# Patient Record
Sex: Male | Born: 1970 | Race: White | Hispanic: No | Marital: Single | Smoking: Current every day smoker
Health system: Southern US, Community
[De-identification: ages and names within clinical notes are randomized; demographics above are authoritative.]

## PROBLEM LIST (undated history)

## (undated) DIAGNOSIS — L309 Dermatitis, unspecified: Secondary | ICD-10-CM

## (undated) HISTORY — PX: SHOULDER ARTHROSCOPY WITH ROTATOR CUFF REPAIR: SHX5685

## (undated) HISTORY — PX: OTHER SURGICAL HISTORY: SHX169

## (undated) HISTORY — DX: Dermatitis, unspecified: L30.9

---

## 2004-07-18 ENCOUNTER — Encounter: Admission: RE | Admit: 2004-07-18 | Discharge: 2004-07-18 | Payer: Self-pay | Admitting: Internal Medicine

## 2005-08-18 IMAGING — US US SCROTUM
1 series · 14 of 25 positions shown · non-contrast
Comparison: None.

CLINICAL DATA: Right scrotal pain and swelling over the past few weeks.  
 SCROTAL ULTRASOUND:

[Series 1: unknown · 0.09mm/px · 14 of 40 slices shown]
[im 1/40]
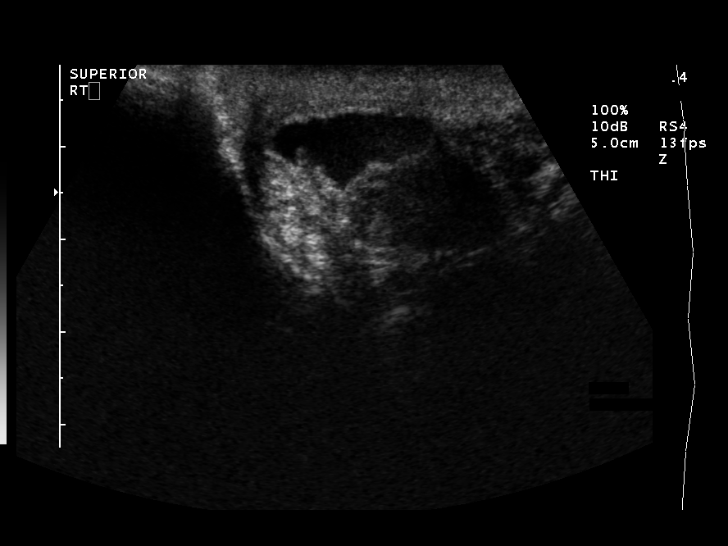
[im 4/40]
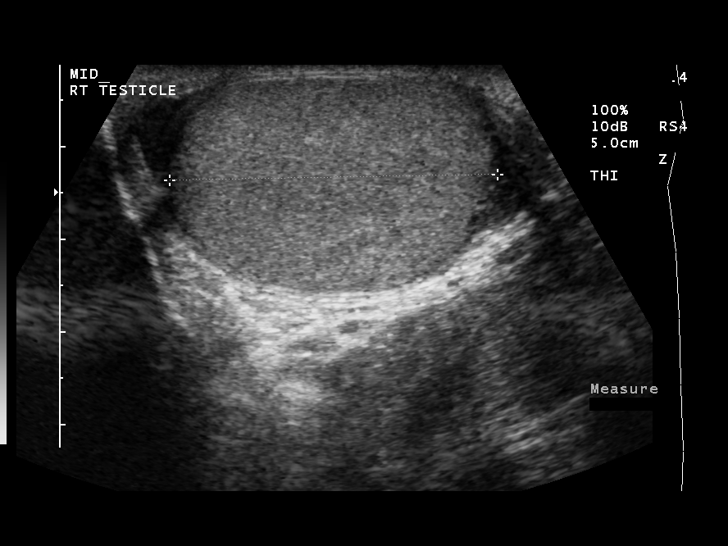
[im 7/40]
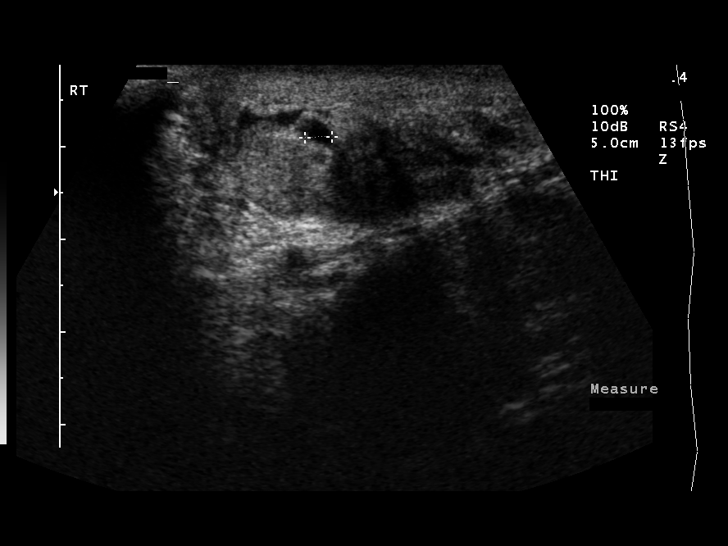
[im 10/40]
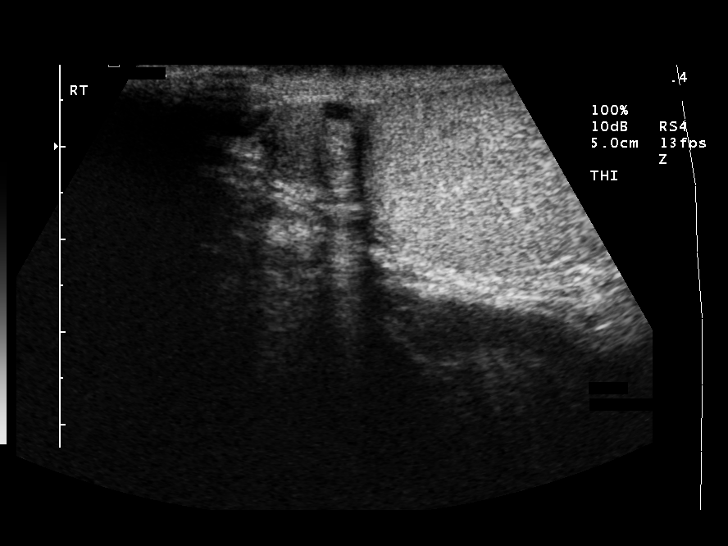
[im 14/40]
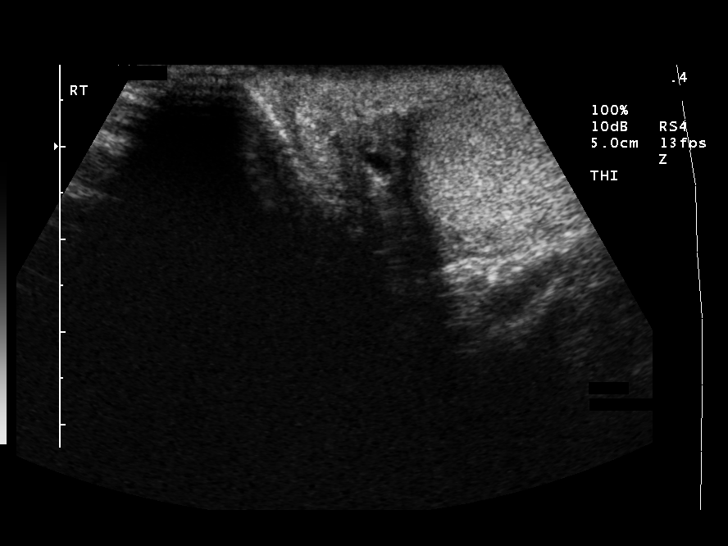
[im 15/40]
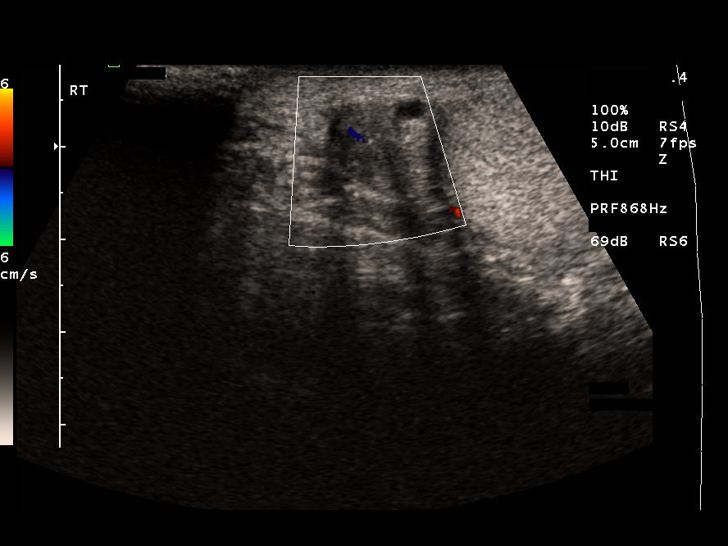
[im 18/40]
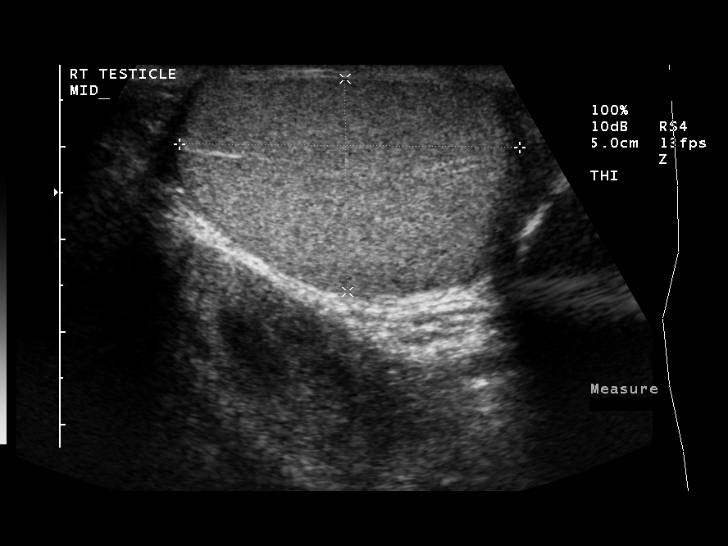
[im 22/40]
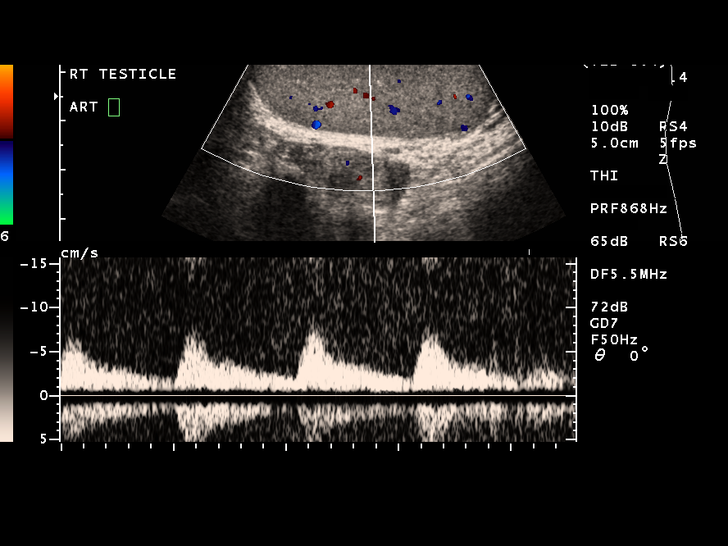
[im 25/40]
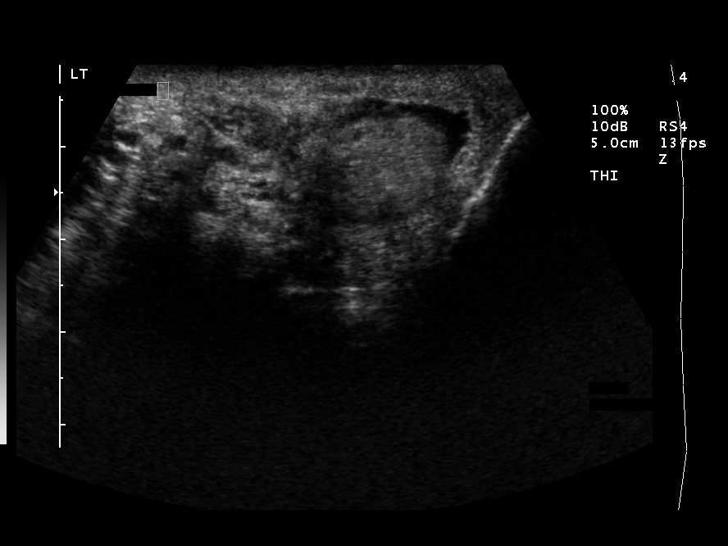
[im 27/40]
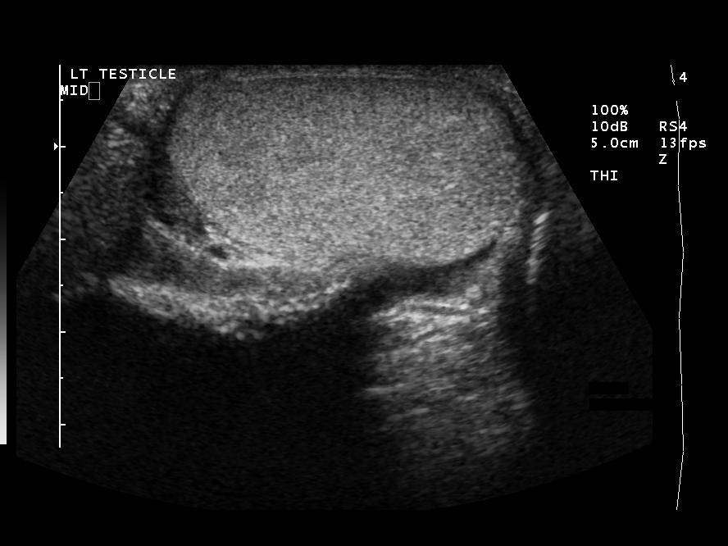
[im 30/40]
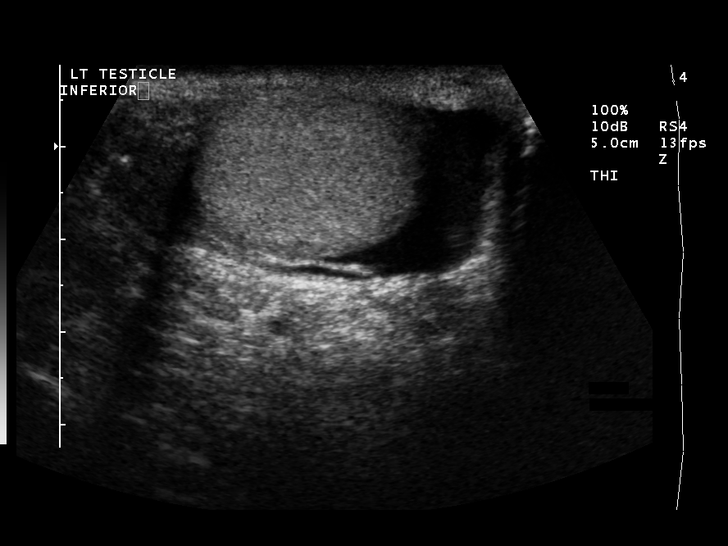
[im 33/40]
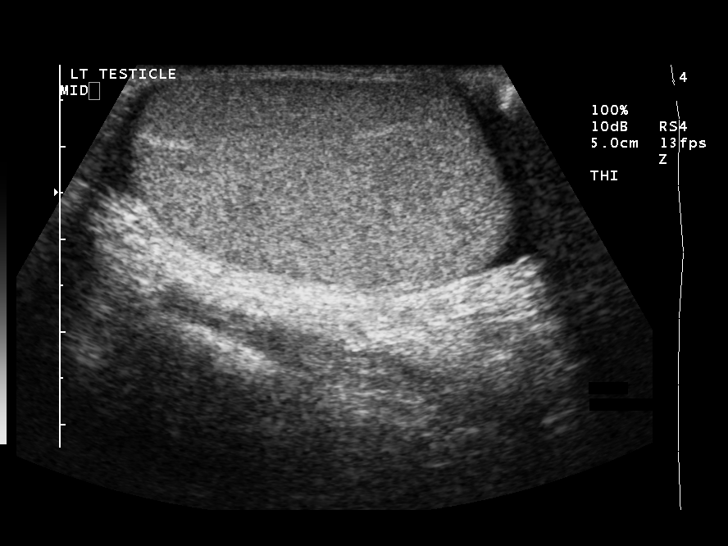
[im 36/40]
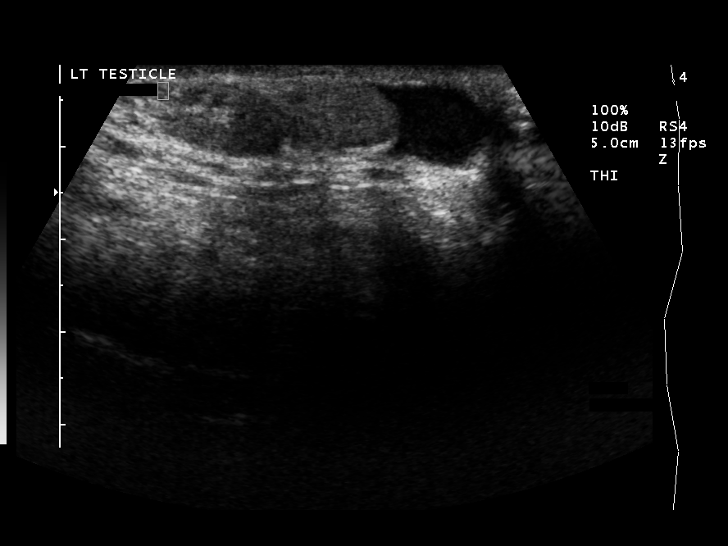
[im 40/40]
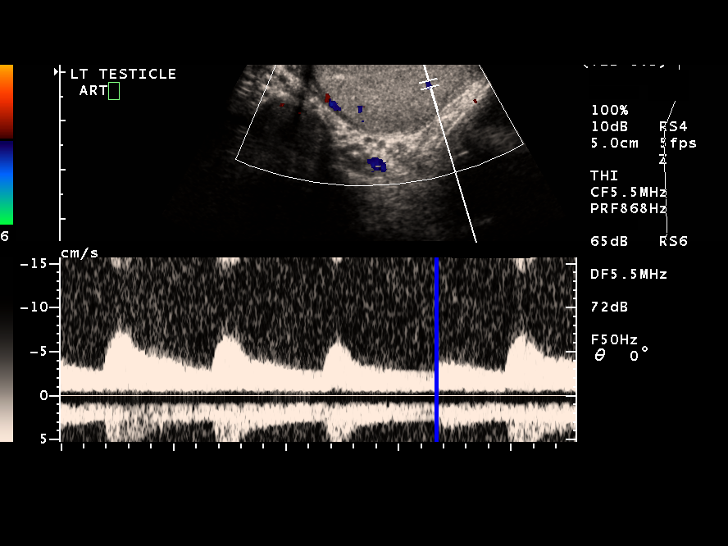

[14 of 25 positions shown; findings below may reference images not displayed]

FINDINGS: Right Testicle:  Normal, 3.6 x 2.3 x 3.5 cm.  Normal arterial signal on Doppler evaluation.
 Left Testicle:  Normal, 3.9 x 2.2 x 3.7 cm.  Normal arterial signal on Doppler evaluation.  
 Right Epididymis:  Two small (approximately 3 mm) epididymal cysts.  No evidence of epididymitis.  
 Left Epididymis:  Normal.
 Other:  Small, insignificant bilateral hydroceles.  No evidence of varicocele.  
 IMPRESSION
 1.  No significant abnormalities.
 2.  Normal testes.  
 3.  Two small (3 mm) right epididymal cysts.  
 4.  Insignificant small bilateral hydroceles.

## 2009-09-21 ENCOUNTER — Ambulatory Visit: Payer: Self-pay | Admitting: Internal Medicine

## 2009-09-21 DIAGNOSIS — Z9189 Other specified personal risk factors, not elsewhere classified: Secondary | ICD-10-CM | POA: Insufficient documentation

## 2009-09-21 DIAGNOSIS — S96819A Strain of other specified muscles and tendons at ankle and foot level, unspecified foot, initial encounter: Secondary | ICD-10-CM

## 2009-09-21 DIAGNOSIS — F17200 Nicotine dependence, unspecified, uncomplicated: Secondary | ICD-10-CM

## 2009-09-21 DIAGNOSIS — S93499A Sprain of other ligament of unspecified ankle, initial encounter: Secondary | ICD-10-CM

## 2009-09-21 DIAGNOSIS — J018 Other acute sinusitis: Secondary | ICD-10-CM | POA: Insufficient documentation

## 2020-06-29 ENCOUNTER — Encounter: Payer: Self-pay | Admitting: Gastroenterology

## 2020-08-11 ENCOUNTER — Other Ambulatory Visit: Payer: Self-pay

## 2020-08-11 ENCOUNTER — Ambulatory Visit (AMBULATORY_SURGERY_CENTER): Payer: Self-pay

## 2020-08-11 ENCOUNTER — Encounter: Payer: Self-pay | Admitting: Gastroenterology

## 2020-08-11 VITALS — Ht 69.0 in | Wt 170.0 lb

## 2020-08-11 DIAGNOSIS — Z1211 Encounter for screening for malignant neoplasm of colon: Secondary | ICD-10-CM

## 2020-08-11 NOTE — Progress Notes (Signed)
No egg or soy allergy known to patient  No issues with past sedation with any surgeries or procedures No intubation problems in the past  No FH of Malignant Hyperthermia No diet pills per patient No home 02 use per patient  No blood thinners per patient  Pt denies issues with constipation  No A fib or A flutter  COVID 19 guidelines implemented in [facility] today with Pt and RN  Coupon given to pt in [facility] today , Code to Pharmacy  COVID vaccines completed on 12/2019 per pt;  Due to the COVID-19 pandemic we are asking patients to follow these guidelines. Please only bring one care partner. Please be aware that your care partner may wait in the car in the parking lot or if they feel like they will be too hot to wait in the car, they may wait in the lobby on the 4th floor. All care partners are required to wear a mask the entire time (we do not have any that we can provide them), they need to practice social distancing, and we will do a Covid check for all patient's and care partners when you arrive. Also we will check their temperature and your temperature. If the care partner waits in their car they need to stay in the parking lot the entire time and we will call them on their cell phone when the patient is ready for discharge so they can bring the car to the front of the building. Also all patient's will need to wear a mask into building.

## 2020-08-24 ENCOUNTER — Other Ambulatory Visit: Payer: Self-pay

## 2020-08-24 ENCOUNTER — Ambulatory Visit (AMBULATORY_SURGERY_CENTER): Payer: BC Managed Care – PPO | Admitting: Gastroenterology

## 2020-08-24 ENCOUNTER — Encounter: Payer: Self-pay | Admitting: Gastroenterology

## 2020-08-24 VITALS — BP 111/68 | HR 66 | Temp 97.1°F | Resp 12 | Ht 69.0 in | Wt 170.0 lb

## 2020-08-24 DIAGNOSIS — K635 Polyp of colon: Secondary | ICD-10-CM

## 2020-08-24 DIAGNOSIS — Z1211 Encounter for screening for malignant neoplasm of colon: Secondary | ICD-10-CM

## 2020-08-24 DIAGNOSIS — D125 Benign neoplasm of sigmoid colon: Secondary | ICD-10-CM

## 2020-08-24 DIAGNOSIS — D123 Benign neoplasm of transverse colon: Secondary | ICD-10-CM

## 2020-08-24 MED ORDER — SODIUM CHLORIDE 0.9 % IV SOLN: 500.0000 mL | Freq: Once | INTRAVENOUS | Status: DC

## 2020-08-24 NOTE — Progress Notes (Signed)
Called to room to assist during endoscopic procedure.  Patient ID and intended procedure confirmed with present staff. Received instructions for my participation in the procedure from the performing physician.  

## 2020-08-24 NOTE — Progress Notes (Signed)
Pt's states no medical or surgical changes since previsit or office visit.  [facility] - vitals 

## 2020-08-24 NOTE — Progress Notes (Signed)
PT taken to PACU. Monitors in place. VSS. Report given to RN. 

## 2020-08-24 NOTE — Patient Instructions (Signed)

## 2020-08-24 NOTE — Op Note (Signed)
[facility] Patient Name: Tanner Doyle Procedure Date: 08/24/2020 9:16 AM MRN: [id number] Endoscopist: Ladene Artist , MD Age: 49 Referring MD:  Date of Birth: April 17, 1971 Gender: Male Account #: [id number] Procedure:                Colonoscopy Indications:              Screening for colorectal malignant neoplasm Medicines:                Monitored Anesthesia Care Procedure:                Pre-Anesthesia Assessment:                           - Prior to the procedure, a History and Physical                            was performed, and patient medications and                            allergies were reviewed. The patient's tolerance of                            previous anesthesia was also reviewed. The risks                            and benefits of the procedure and the sedation                            options and risks were discussed with the patient.                            All questions were answered, and informed consent                            was obtained. Prior Anticoagulants: The patient has                            taken no previous anticoagulant or antiplatelet                            agents. ASA Grade Assessment: II - A patient with                            mild systemic disease. After reviewing the risks                            and benefits, the patient was deemed in                            satisfactory condition to undergo the procedure.                           After obtaining informed consent, the colonoscope  was passed under direct vision. Throughout the                            procedure, the patient's blood pressure, pulse, and                            oxygen saturations were monitored continuously. The                            Colonoscope was introduced through the anus and                            advanced to the the cecum, identified by                            appendiceal orifice and  ileocecal valve. The                            ileocecal valve, appendiceal orifice, and rectum                            were photographed. The quality of the bowel                            preparation was good. The colonoscopy was performed                            without difficulty. The patient tolerated the                            procedure well. Scope In: 9:26:00 AM Scope Out: 9:46:40 AM Scope Withdrawal Time: 0 hours 16 minutes 43 seconds  Total Procedure Duration: 0 hours 20 minutes 40 seconds  Findings:                 The perianal and digital rectal examinations were                            normal.                           Four sessile polyps were found in the sigmoid colon                            (3) and transverse colon (1). The polyps were 6 to                            8 mm in size. These polyps were removed with a cold                            snare. Resection and retrieval were complete.                           Internal hemorrhoids were found during  retroflexion. The hemorrhoids were medium-sized and                            Grade I (internal hemorrhoids that do not prolapse).                           The exam was otherwise without abnormality on                            direct and retroflexion views. Complications:            No immediate complications. Estimated blood loss:                            None. Estimated Blood Loss:     Estimated blood loss: none. Impression:               - Four 6 to 8 mm polyps in the sigmoid colon and in                            the transverse colon, removed with a cold snare.                            Resected and retrieved.                           - Internal hemorrhoids.                           - The examination was otherwise normal on direct                            and retroflexion views. Recommendation:           - Repeat colonoscopy after studies are complete for                             surveillance based on pathology results.                           - Patient has a contact number available for                            emergencies. The signs and symptoms of potential                            delayed complications were discussed with the                            patient. Return to normal activities tomorrow.                            Written discharge instructions were provided to the                            patient.                           -  Resume previous diet.                           - Continue present medications.                           - Await pathology results. Ladene Artist, MD 08/24/2020 9:49:50 AM This report has been signed electronically.

## 2020-08-26 ENCOUNTER — Telehealth: Payer: Self-pay

## 2020-08-26 NOTE — Telephone Encounter (Signed)
  Follow up Call-  Call back number 08/24/2020  Post procedure Call Back phone  # [phone]  Permission to leave phone message Yes  Some recent data might be hidden     Patient questions:  Do you have a fever, pain , or abdominal swelling? No. Pain Score  0 *  Have you tolerated food without any problems? Yes.    Have you been able to return to your normal activities? Yes.    Do you have any questions about your discharge instructions: Diet   No. Medications  No. Follow up visit  No.  Do you have questions or concerns about your Care? No.  Actions: * If pain score is 4 or above: No action needed, pain <4. 1. Have you developed a fever since your procedure? no  2.   Have you had an respiratory symptoms (SOB or cough) since your procedure? no  3.   Have you tested positive for COVID 19 since your procedure no  4.   Have you had any family members/close contacts diagnosed with the COVID 19 since your procedure?  no   If yes to any of these questions please route to Joylene John, RN and Joella Prince, RN

## 2020-08-26 NOTE — Telephone Encounter (Signed)
Left message on answering machine. 

## 2020-09-12 ENCOUNTER — Encounter: Payer: Self-pay | Admitting: Gastroenterology
